# Patient Record
Sex: Male | Born: 1965 | Race: White | Hispanic: No | Marital: Married
Health system: Southern US, Community
[De-identification: ages and names within clinical notes are randomized; demographics above are authoritative.]

---

## 2013-05-15 ENCOUNTER — Other Ambulatory Visit: Payer: Self-pay | Admitting: Orthopedic Surgery

## 2013-05-15 ENCOUNTER — Ambulatory Visit
Admission: RE | Admit: 2013-05-15 | Discharge: 2013-05-15 | Disposition: A | Discharge status: Home / Self Care | Payer: 59 | Source: Ambulatory Visit | Attending: Orthopedic Surgery | Admitting: Orthopedic Surgery

## 2013-05-15 DIAGNOSIS — IMO0002 Reserved for concepts with insufficient information to code with codable children: Secondary | ICD-10-CM

## 2013-06-12 ENCOUNTER — Other Ambulatory Visit (HOSPITAL_COMMUNITY): Payer: Self-pay | Admitting: Orthopedic Surgery

## 2013-06-12 DIAGNOSIS — S32009A Unspecified fracture of unspecified lumbar vertebra, initial encounter for closed fracture: Secondary | ICD-10-CM

## 2013-06-16 ENCOUNTER — Encounter (HOSPITAL_COMMUNITY): Payer: 59

## 2013-07-07 ENCOUNTER — Other Ambulatory Visit (HOSPITAL_COMMUNITY): Payer: Self-pay | Admitting: Orthopedic Surgery

## 2013-07-07 DIAGNOSIS — S32009A Unspecified fracture of unspecified lumbar vertebra, initial encounter for closed fracture: Secondary | ICD-10-CM

## 2013-07-11 ENCOUNTER — Other Ambulatory Visit (HOSPITAL_COMMUNITY): Payer: Self-pay | Admitting: Orthopedic Surgery

## 2013-07-11 ENCOUNTER — Other Ambulatory Visit: Payer: Self-pay | Admitting: Orthopedic Surgery

## 2013-07-11 DIAGNOSIS — IMO0002 Reserved for concepts with insufficient information to code with codable children: Secondary | ICD-10-CM

## 2013-07-12 ENCOUNTER — Encounter (HOSPITAL_COMMUNITY): Payer: 59

## 2013-07-19 ENCOUNTER — Other Ambulatory Visit: Payer: 59

## 2014-07-26 IMAGING — CT CT L SPINE W/O CM
4 of 10 series · 10 of 33 positions shown, 12 images · non-contrast
Comparison: Lumbar spine radiographs, 11/02/2012.

CLINICAL DATA: Back pain. Known L1 compression fracture. Injury on
05/19/2012.

EXAM:
CT LUMBAR SPINE WITHOUT CONTRAST
TECHNIQUE: Multidetector CT imaging of the lumbar spine was performed without
intravenous contrast administration. Multiplanar CT image
reconstructions were also generated.

[Series 2: l spine bone · axial · 0.27mm/px · z∈[-242,-162]mm · 2 of 97 slices shown, 3 images]
[im 33/97  soft-tissue]
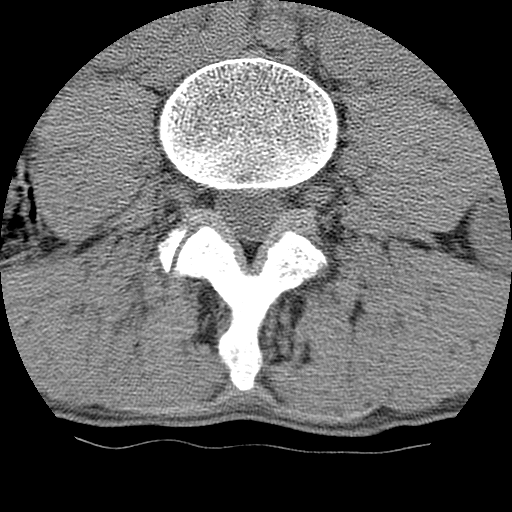
[im 33/97  bone]
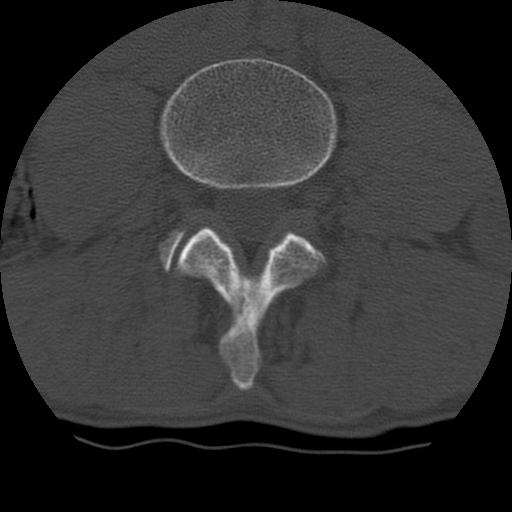
[im 65/97  bone]
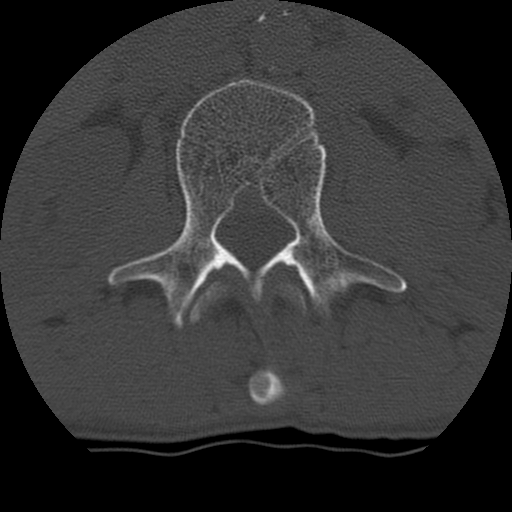

[Series 3: l spine soft · axial · 0.27mm/px · z∈[-242,-162]mm · 2 of 97 slices shown]
[im 33/97  soft-tissue]
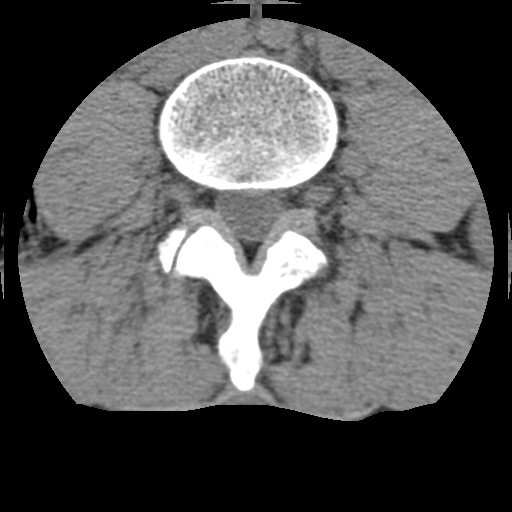
[im 65/97  soft-tissue]
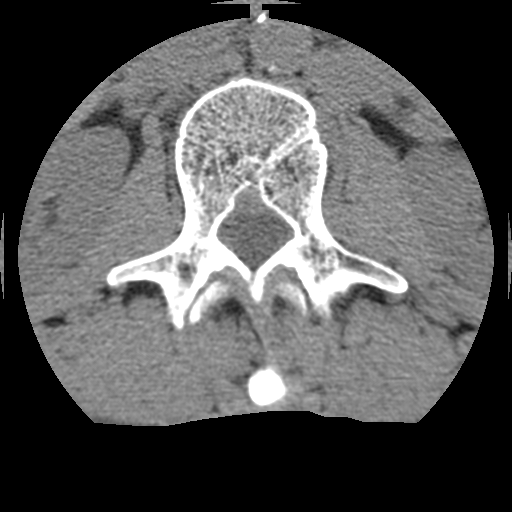

[Series 104: cor/lower · coronal · 0.27mm/px · 1 of 67 slices shown]
[im 34/67  bone]
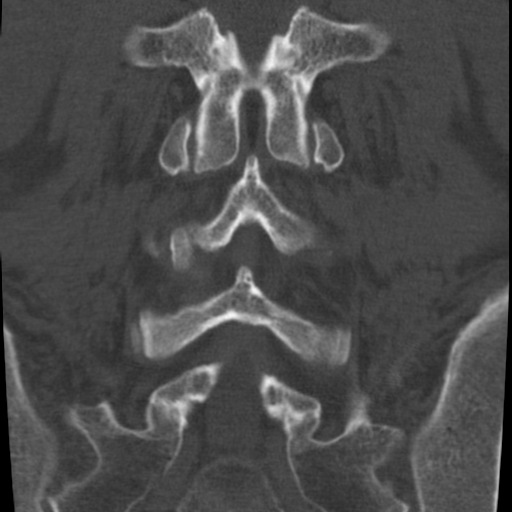

[Series 105: sag · sagittal · 0.48mm/px · 5 of 67 slices shown, 6 images]
[im 23/67  bone]
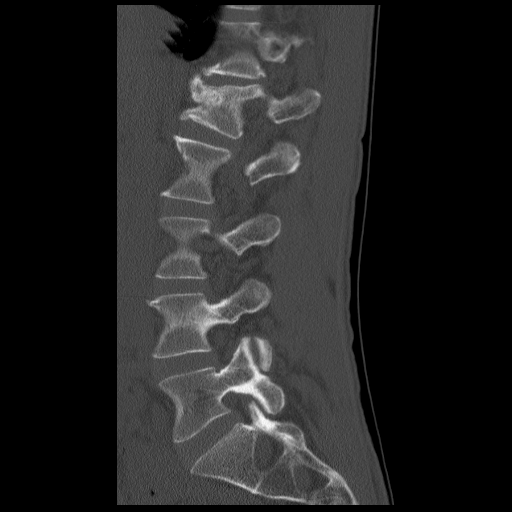
[im 28/67  bone]
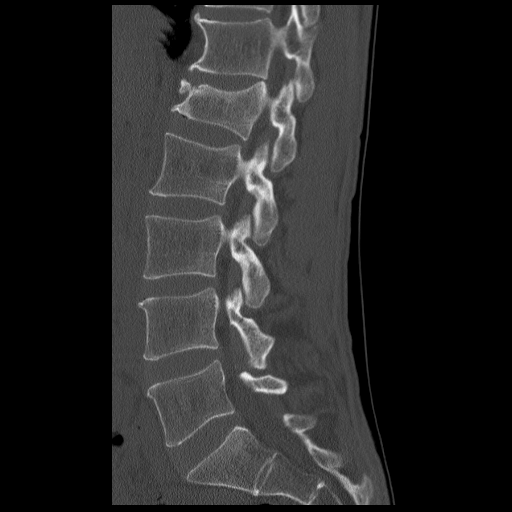
[im 34/67  soft-tissue]
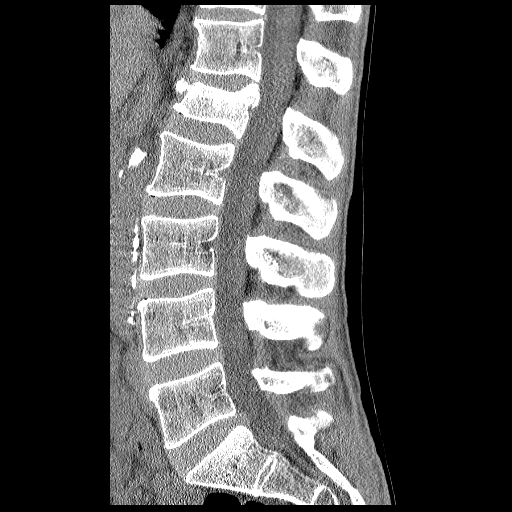
[im 34/67  bone]
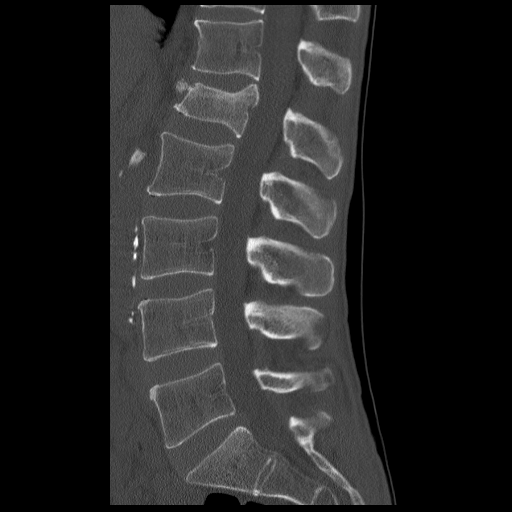
[im 39/67  bone]
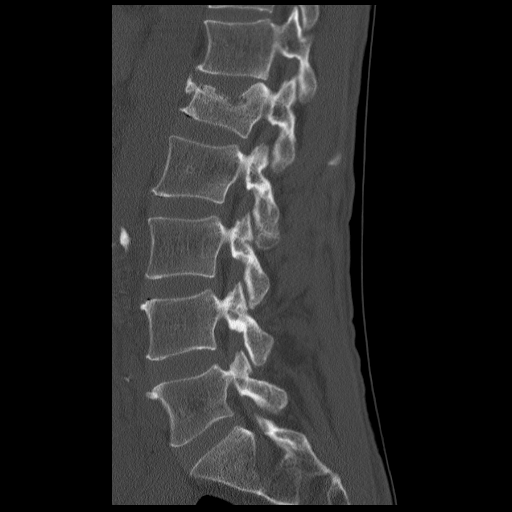
[im 45/67  bone]
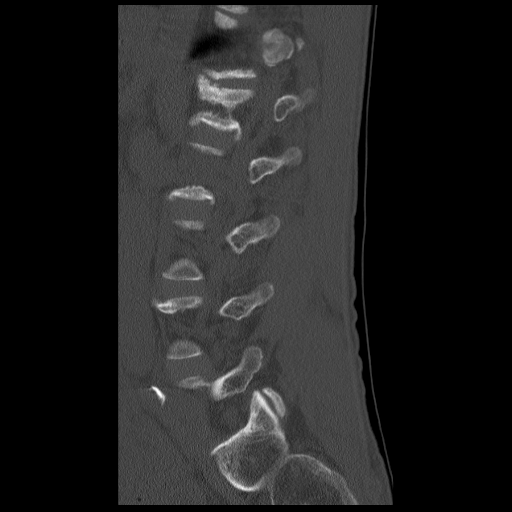

[10 of 33 positions shown; findings below may reference images not displayed]

FINDINGS: There has been increase in the amount of vertebral height lost at
the fractured L1 level. The central and anterior endplate are
depressed. The posterior superior aspect of the vertebral body
mildly bulges posteriorly, but does not cause significant stenosis.

Are remaining lumbar vertebrae are normal in height. There is no
spondylolisthesis. The disc spaces are well maintained. Minor
endplate spurring. No other degenerative change.

There is no significant disc bulging. No disc herniations. The
central spinal canal, lateral recesses and the neural foramina are
widely patent.

The surrounding soft tissues show aortoiliac vascular calcifications
but are otherwise unremarkable.
IMPRESSION: 1. There has been an increase in the degree of compression of the
fractured L1 vertebrae. However, this change does not appear to be
acute.
2. Mild posterior bulging of the upper posterior cortex of the
fractured L1 vertebrae. This slightly indents the ventral thecal sac
but does not cause significant stenosis.
3. No significant disc bulging. No disc herniations. No significant
stenosis.

## 2015-02-18 ENCOUNTER — Other Ambulatory Visit: Payer: Self-pay | Admitting: Orthopedic Surgery

## 2015-02-18 DIAGNOSIS — Z8781 Personal history of (healed) traumatic fracture: Secondary | ICD-10-CM

## 2015-02-19 ENCOUNTER — Other Ambulatory Visit: Payer: Self-pay | Admitting: Orthopedic Surgery

## 2015-02-19 DIAGNOSIS — R29890 Loss of height: Secondary | ICD-10-CM

## 2015-02-22 ENCOUNTER — Ambulatory Visit (HOSPITAL_BASED_OUTPATIENT_CLINIC_OR_DEPARTMENT_OTHER)
Admission: RE | Admit: 2015-02-22 | Discharge: 2015-02-22 | Disposition: A | Discharge status: Home / Self Care | Payer: 59 | Source: Ambulatory Visit | Attending: Orthopedic Surgery | Admitting: Orthopedic Surgery

## 2015-02-22 DIAGNOSIS — M8580 Other specified disorders of bone density and structure, unspecified site: Secondary | ICD-10-CM | POA: Insufficient documentation

## 2015-02-22 DIAGNOSIS — R29890 Loss of height: Secondary | ICD-10-CM | POA: Diagnosis present

## 2023-03-14 DIAGNOSIS — Z419 Encounter for procedure for purposes other than remedying health state, unspecified: Secondary | ICD-10-CM | POA: Diagnosis not present

## 2023-04-14 DIAGNOSIS — Z419 Encounter for procedure for purposes other than remedying health state, unspecified: Secondary | ICD-10-CM | POA: Diagnosis not present

## 2023-05-15 DIAGNOSIS — Z419 Encounter for procedure for purposes other than remedying health state, unspecified: Secondary | ICD-10-CM | POA: Diagnosis not present

## 2023-06-12 DIAGNOSIS — Z419 Encounter for procedure for purposes other than remedying health state, unspecified: Secondary | ICD-10-CM | POA: Diagnosis not present

## 2023-07-24 DIAGNOSIS — Z419 Encounter for procedure for purposes other than remedying health state, unspecified: Secondary | ICD-10-CM | POA: Diagnosis not present

## 2023-08-23 DIAGNOSIS — Z419 Encounter for procedure for purposes other than remedying health state, unspecified: Secondary | ICD-10-CM | POA: Diagnosis not present

## 2023-09-23 DIAGNOSIS — Z419 Encounter for procedure for purposes other than remedying health state, unspecified: Secondary | ICD-10-CM | POA: Diagnosis not present

## 2023-10-23 DIAGNOSIS — Z419 Encounter for procedure for purposes other than remedying health state, unspecified: Secondary | ICD-10-CM | POA: Diagnosis not present

## 2023-11-23 DIAGNOSIS — Z419 Encounter for procedure for purposes other than remedying health state, unspecified: Secondary | ICD-10-CM | POA: Diagnosis not present

## 2023-12-24 DIAGNOSIS — Z419 Encounter for procedure for purposes other than remedying health state, unspecified: Secondary | ICD-10-CM | POA: Diagnosis not present

## 2024-03-24 DIAGNOSIS — Z419 Encounter for procedure for purposes other than remedying health state, unspecified: Secondary | ICD-10-CM | POA: Diagnosis not present
